# Patient Record
Sex: Female | Born: 2000 | Race: White | Hispanic: No | Marital: Single | Smoking: Never smoker
Health system: Southern US, Community
[De-identification: ages and names within clinical notes are randomized; demographics above are authoritative.]

## PROBLEM LIST (undated history)

## (undated) DIAGNOSIS — J45909 Unspecified asthma, uncomplicated: Secondary | ICD-10-CM

## (undated) DIAGNOSIS — E162 Hypoglycemia, unspecified: Secondary | ICD-10-CM

## (undated) HISTORY — PX: WISDOM TOOTH EXTRACTION: SHX21

## (undated) HISTORY — PX: TONSILLECTOMY: SUR1361

## (undated) HISTORY — PX: ADENOIDECTOMY: SUR15

---
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
## 2020-08-13 NOTE — L&D Delivery Note (Signed)
DELIVERY NOTE  Pt complete and at +2 station with urge to push. Patient desired to deliver on hands and knees. Pt pushed and delivered a viable female infant in LOA position. Anterior and posterior shoulders spontaneously delivered with next two pushes; body easily followed next. Infant placed on mothers abdomen and bulb suction of mouth and nose performed. Cord was then clamped and cut by FOB. Cord blood obtained, 3VC. Baby had a vigorous spontaneous cry noted. Placenta then delivered at 0207 intact. Fundal massage performed and pitocin per protocol. Fundus firm. The following lacerations were noted: labial abrasions, no repair needed, EBL 200cc. Mother and baby stable. Counts correct   Infant time: 31 Gender: female, considering circ Placenta time: 0207 Apgars: 9/9 Weight: pending skin-to-skin

## 2020-11-08 LAB — OB RESULTS CONSOLE HEPATITIS B SURFACE ANTIGEN: Hepatitis B Surface Ag: NEGATIVE

## 2020-11-08 LAB — OB RESULTS CONSOLE RUBELLA ANTIBODY, IGM: Rubella: IMMUNE

## 2020-11-08 LAB — OB RESULTS CONSOLE GC/CHLAMYDIA
Chlamydia: NEGATIVE
Gonorrhea: NEGATIVE

## 2020-11-08 LAB — HEPATITIS C ANTIBODY: HCV Ab: NEGATIVE

## 2020-11-08 LAB — OB RESULTS CONSOLE RPR: RPR: NONREACTIVE

## 2020-11-08 LAB — OB RESULTS CONSOLE HIV ANTIBODY (ROUTINE TESTING): HIV: NONREACTIVE

## 2021-04-03 ENCOUNTER — Other Ambulatory Visit: Payer: Self-pay

## 2021-04-03 ENCOUNTER — Encounter (HOSPITAL_COMMUNITY): Payer: Self-pay | Admitting: Obstetrics and Gynecology

## 2021-04-03 ENCOUNTER — Inpatient Hospital Stay (HOSPITAL_COMMUNITY): Payer: Medicaid Other

## 2021-04-03 ENCOUNTER — Inpatient Hospital Stay (HOSPITAL_COMMUNITY)
Admission: AD | Admit: 2021-04-03 | Discharge: 2021-04-04 | Disposition: A | Discharge status: Home / Self Care | Payer: Medicaid Other | Attending: Obstetrics and Gynecology | Admitting: Obstetrics and Gynecology

## 2021-04-03 DIAGNOSIS — Z79899 Other long term (current) drug therapy: Secondary | ICD-10-CM | POA: Insufficient documentation

## 2021-04-03 DIAGNOSIS — Z3A32 32 weeks gestation of pregnancy: Secondary | ICD-10-CM | POA: Diagnosis not present

## 2021-04-03 DIAGNOSIS — O26893 Other specified pregnancy related conditions, third trimester: Secondary | ICD-10-CM | POA: Diagnosis not present

## 2021-04-03 DIAGNOSIS — U071 COVID-19: Secondary | ICD-10-CM

## 2021-04-03 DIAGNOSIS — R059 Cough, unspecified: Secondary | ICD-10-CM

## 2021-04-03 DIAGNOSIS — O9852 Other viral diseases complicating childbirth: Secondary | ICD-10-CM | POA: Insufficient documentation

## 2021-04-03 DIAGNOSIS — R079 Chest pain, unspecified: Secondary | ICD-10-CM | POA: Insufficient documentation

## 2021-04-03 DIAGNOSIS — O98513 Other viral diseases complicating pregnancy, third trimester: Secondary | ICD-10-CM | POA: Diagnosis not present

## 2021-04-03 DIAGNOSIS — J111 Influenza due to unidentified influenza virus with other respiratory manifestations: Secondary | ICD-10-CM

## 2021-04-03 HISTORY — DX: Unspecified asthma, uncomplicated: J45.909

## 2021-04-03 LAB — COMPREHENSIVE METABOLIC PANEL
ALT: 32 U/L (ref 0–44)
AST: 32 U/L (ref 15–41)
Albumin: 2.9 g/dL — ABNORMAL LOW (ref 3.5–5.0)
Alkaline Phosphatase: 79 U/L (ref 38–126)
Anion gap: 10 (ref 5–15)
BUN: <5 mg/dL — ABNORMAL LOW (ref 6–20)
CO2: 18 mmol/L — ABNORMAL LOW (ref 22–32)
Calcium: 8.6 mg/dL — ABNORMAL LOW (ref 8.9–10.3)
Chloride: 103 mmol/L (ref 98–111)
Creatinine, Ser: 0.53 mg/dL (ref 0.44–1.00)
GFR, Estimated: >60 mL/min (ref >60)
Glucose, Bld: 86 mg/dL (ref 70–99)
Potassium: 3.1 mmol/L — ABNORMAL LOW (ref 3.5–5.1)
Sodium: 131 mmol/L — ABNORMAL LOW (ref 135–145)
Total Bilirubin: 0.6 mg/dL (ref 0.3–1.2)
Total Protein: 5.6 g/dL — ABNORMAL LOW (ref 6.5–8.1)

## 2021-04-03 LAB — CBC WITH DIFFERENTIAL/PLATELET
Abs Immature Granulocytes: 0.04 10*3/uL (ref 0.00–0.07)
Basophils Absolute: 0 10*3/uL (ref 0.0–0.1)
Basophils Relative: 0 %
Eosinophils Absolute: 0 10*3/uL (ref 0.0–0.5)
Eosinophils Relative: 1 %
HCT: 33.8 % — ABNORMAL LOW (ref 36.0–46.0)
Hemoglobin: 11.4 g/dL — ABNORMAL LOW (ref 12.0–15.0)
Immature Granulocytes: 1 %
Lymphocytes Relative: 5 %
Lymphs Abs: 0.4 10*3/uL — ABNORMAL LOW (ref 0.7–4.0)
MCH: 32.9 pg (ref 26.0–34.0)
MCHC: 33.7 g/dL (ref 30.0–36.0)
MCV: 97.4 fL (ref 80.0–100.0)
Monocytes Absolute: 0.9 10*3/uL (ref 0.1–1.0)
Monocytes Relative: 11 %
Neutro Abs: 6.6 10*3/uL (ref 1.7–7.7)
Neutrophils Relative %: 82 %
Platelets: 180 10*3/uL (ref 150–400)
RBC: 3.47 MIL/uL — ABNORMAL LOW (ref 3.87–5.11)
RDW: 12.2 % (ref 11.5–15.5)
WBC: 8 10*3/uL (ref 4.0–10.5)
nRBC: 0 % (ref 0.0–0.2)

## 2021-04-03 LAB — URINALYSIS, ROUTINE W REFLEX MICROSCOPIC
Bilirubin Urine: NEGATIVE
Glucose, UA: NEGATIVE mg/dL
Hgb urine dipstick: NEGATIVE
Ketones, ur: 20 mg/dL — AB
Leukocytes,Ua: NEGATIVE
Nitrite: NEGATIVE
Protein, ur: NEGATIVE mg/dL
Specific Gravity, Urine: 1.014 (ref 1.005–1.030)
pH: 6 (ref 5.0–8.0)

## 2021-04-03 LAB — TROPONIN I (HIGH SENSITIVITY): Troponin I (High Sensitivity): 8 ng/L (ref <18)

## 2021-04-03 LAB — LACTIC ACID, PLASMA: Lactic Acid, Venous: 1.1 mmol/L (ref 0.5–1.9)

## 2021-04-03 LAB — BRAIN NATRIURETIC PEPTIDE: B Natriuretic Peptide: 46.1 pg/mL (ref 0.0–100.0)

## 2021-04-03 MED ORDER — ACETAMINOPHEN 500 MG PO TABS
1000.0000 mg | ORAL_TABLET | Freq: Once | ORAL | Status: AC
  Administered 2021-04-03: 1000 mg via ORAL
  Filled 2021-04-03: qty 2

## 2021-04-03 MED ORDER — ONDANSETRON HCL 4 MG/2ML IJ SOLN
4.0000 mg | Freq: Once | INTRAMUSCULAR | Status: AC
  Administered 2021-04-03: 4 mg via INTRAVENOUS
  Filled 2021-04-03: qty 2

## 2021-04-03 MED ORDER — LACTATED RINGERS IV BOLUS
1000.0000 mL | Freq: Once | INTRAVENOUS | Status: AC
  Administered 2021-04-03: 1000 mL via INTRAVENOUS

## 2021-04-03 NOTE — MAU Note (Signed)
Pt reports pain in her bilateral lower/mid back since this am, pain is now radiating to her lower abd. Denies bleeding . Reports good fetal movment.

## 2021-04-03 NOTE — MAU Provider Note (Signed)
History     CSN: [id number]  Arrival date and time: 04/03/21 [id number]   Event Date/Time   First Provider Initiated Contact with Patient 04/03/21 2035      Chief Complaint  Patient presents with   Back Pain   Abdominal Pain   HPI Deborah Jarvis is a 20 y.o. G1P0 at 5613w1d who presents with body aches, sore throat, and cough.  Symptoms started 3 days ago.  Went to her PCP today and had a negative COVID test.  Body aches worsened today so she presents here.  Reports productive cough with associated substernal chest pain.  Chest pain occurs with coughing episodes.  Had fever in the office earlier today.  Reports body aches but pain is primarily throughout her low back.  She rates it a 7/10.  She has not been treating her symptoms.  Denies shortness of breath, abdominal pain, vaginal bleeding.  Reports good fetal movement.  OB History     Gravida  1   Para      Term      Preterm      AB      Living         SAB      IAB      Ectopic      Multiple      Live Births              Past Medical History:  Diagnosis Date   Asthma     Past Surgical History:  Procedure Laterality Date   ADENOIDECTOMY     TONSILLECTOMY     WISDOM TOOTH EXTRACTION      History reviewed. No pertinent family history.  Social History   Tobacco Use   Smoking status: Never    Allergies:  Allergies  Allergen Reactions   Morphine And Related     Facility-Administered Medications Prior to Admission  Medication Dose Route Frequency Provider Last Rate Last Admin   omalizumab Geoffry Paradise(XOLAIR) injection 150 mg  150 mg Subcutaneous Q28 days Jessica PriestKozlow, Eric J, MD   150 mg at 02/28/17 1546   Medications Prior to Admission  Medication Sig Dispense Refill Last Dose   albuterol (PROAIR HFA) 108 (90 BASE) MCG/ACT inhaler Inhale 2 puffs into the lungs every 4 (four) hours as needed for wheezing or shortness of breath.      amoxicillin-clavulanate (AUGMENTIN) 875-125 MG tablet Take 1 tablet by mouth 2  (two) times daily.      cetirizine (ZYRTEC) 10 MG tablet Take 10 mg by mouth daily.      cyproheptadine (PERIACTIN) 4 MG tablet TAKE 1/2 TO 1 TABLET EVERY EVENING AS DIRECTED 30 tablet 5    EPINEPHrine (EPIPEN 2-PAK) 0.3 mg/0.3 mL IJ SOAJ injection Inject 0.3 mg into the muscle once.      ranitidine (ZANTAC) 150 MG tablet Take 150 mg by mouth at bedtime.      Water For Injection Sterile (STERILE WATER, PRESERVATIVE FREE,) injection Inject 10 mLs as directed as directed. 10 mL 11    XOLAIR 150 MG injection INJECT 150MG  SUBCUTANEOUSLY EVER 28 DAYS 1 each 11     Review of Systems  Constitutional:  Positive for chills, fatigue and fever.  HENT:  Positive for sore throat.   Respiratory:  Positive for cough. Negative for shortness of breath and wheezing.   Cardiovascular:  Positive for chest pain.  Gastrointestinal: Negative.   Genitourinary: Negative.   Musculoskeletal:  Positive for myalgias.  Neurological:  Positive for headaches.  Physical Exam   Blood pressure (!) 109/51, pulse 87, temperature 99.3 F (37.4 C), resp. rate 16, height 5' (1.524 m), weight 62.1 kg, SpO2 100 %.  Patient Vitals for the past 24 hrs:  BP Temp Temp src Pulse Resp SpO2 Height Weight  04/04/21 0010 (!) 109/51 -- -- 87 16 100 % -- --  04/03/21 2207 (!) 110/45 99.3 F (37.4 C) -- (!) 107 17 98 % -- --  04/03/21 2113 129/67 -- -- (!) 117 -- -- -- --  04/03/21 2110 -- -- -- -- -- 99 % -- --  04/03/21 2105 -- -- -- -- -- 99 % -- --  04/03/21 2100 -- -- -- -- -- 100 % -- --  04/03/21 2055 -- -- -- -- -- 99 % -- --  04/03/21 2050 -- -- -- -- -- 98 % -- --  04/03/21 2045 -- -- -- -- -- 99 % -- --  04/03/21 2040 -- -- -- -- -- 100 % -- --  04/03/21 2035 -- -- -- -- -- 99 % -- --  04/03/21 2030 -- -- -- -- -- 100 % -- --  04/03/21 2025 -- -- -- -- -- 99 % -- --  04/03/21 2020 -- -- -- -- -- 99 % -- --  04/03/21 2015 -- -- -- -- -- 99 % -- --  04/03/21 2010 120/61 99.7 F (37.6 C) Oral (!) 142 20 100 % 5' (1.524  m) 62.1 kg    Physical Exam Vitals and nursing note reviewed.  Constitutional:      General: She is not in acute distress.    Appearance: She is well-developed. She is ill-appearing.  HENT:     Head: Normocephalic and atraumatic.  Eyes:     General: No scleral icterus. Cardiovascular:     Rate and Rhythm: Regular rhythm. Tachycardia present.     Heart sounds: Normal heart sounds.  Pulmonary:     Effort: Pulmonary effort is normal. No respiratory distress.     Breath sounds: Normal breath sounds. No wheezing.  Skin:    General: Skin is warm and dry.  Neurological:     Mental Status: She is alert.  Psychiatric:        Mood and Affect: Mood normal.        Behavior: Behavior normal.   NST:  Baseline: 150 bpm, Variability: Good {> 6 bpm), Accelerations: Reactive, and Decelerations: Absent  MAU Course  Procedures Results for orders placed or performed during the hospital encounter of 04/03/21 (from the past 24 hour(s))  SARS CORONAVIRUS 2 (TAT 6-24 HRS) Nasopharyngeal Nasopharyngeal Swab     Status: Abnormal   Collection Time: 04/03/21  8:27 PM   Specimen: Nasopharyngeal Swab  Result Value Ref Range   SARS Coronavirus 2 POSITIVE (A) NEGATIVE  CBC with Differential/Platelet     Status: Abnormal   Collection Time: 04/03/21  9:21 PM  Result Value Ref Range   WBC 8.0 4.0 - 10.5 K/uL   RBC 3.47 (L) 3.87 - 5.11 MIL/uL   Hemoglobin 11.4 (L) 12.0 - 15.0 g/dL   HCT 19.4 (L) 17.4 - 08.1 %   MCV 97.4 80.0 - 100.0 fL   MCH 32.9 26.0 - 34.0 pg   MCHC 33.7 30.0 - 36.0 g/dL   RDW 44.8 18.5 - 63.1 %   Platelets 180 150 - 400 K/uL   nRBC 0.0 0.0 - 0.2 %   Neutrophils Relative % 82 %   Neutro Abs 6.6 1.7 - 7.7  K/uL   Lymphocytes Relative 5 %   Lymphs Abs 0.4 (L) 0.7 - 4.0 K/uL   Monocytes Relative 11 %   Monocytes Absolute 0.9 0.1 - 1.0 K/uL   Eosinophils Relative 1 %   Eosinophils Absolute 0.0 0.0 - 0.5 K/uL   Basophils Relative 0 %   Basophils Absolute 0.0 0.0 - 0.1 K/uL    Immature Granulocytes 1 %   Abs Immature Granulocytes 0.04 0.00 - 0.07 K/uL  Lactic acid, plasma     Status: None   Collection Time: 04/03/21  9:21 PM  Result Value Ref Range   Lactic Acid, Venous 1.1 0.5 - 1.9 mmol/L  Comprehensive metabolic panel     Status: Abnormal   Collection Time: 04/03/21  9:21 PM  Result Value Ref Range   Sodium 131 (L) 135 - 145 mmol/L   Potassium 3.1 (L) 3.5 - 5.1 mmol/L   Chloride 103 98 - 111 mmol/L   CO2 18 (L) 22 - 32 mmol/L   Glucose, Bld 86 70 - 99 mg/dL   BUN <5 (L) 6 - 20 mg/dL   Creatinine, Ser 3.29 0.44 - 1.00 mg/dL   Calcium 8.6 (L) 8.9 - 10.3 mg/dL   Total Protein 5.6 (L) 6.5 - 8.1 g/dL   Albumin 2.9 (L) 3.5 - 5.0 g/dL   AST 32 15 - 41 U/L   ALT 32 0 - 44 U/L   Alkaline Phosphatase 79 38 - 126 U/L   Total Bilirubin 0.6 0.3 - 1.2 mg/dL   GFR, Estimated >51 >88 mL/min   Anion gap 10 5 - 15  Troponin I (High Sensitivity)     Status: None   Collection Time: 04/03/21  9:21 PM  Result Value Ref Range   Troponin I (High Sensitivity) 8 <18 ng/L  Brain natriuretic peptide     Status: None   Collection Time: 04/03/21  9:21 PM  Result Value Ref Range   B Natriuretic Peptide 46.1 0.0 - 100.0 pg/mL  Urinalysis, Routine w reflex microscopic     Status: Abnormal   Collection Time: 04/03/21 10:10 PM  Result Value Ref Range   Color, Urine YELLOW YELLOW   APPearance HAZY (A) CLEAR   Specific Gravity, Urine 1.014 1.005 - 1.030   pH 6.0 5.0 - 8.0   Glucose, UA NEGATIVE NEGATIVE mg/dL   Hgb urine dipstick NEGATIVE NEGATIVE   Bilirubin Urine NEGATIVE NEGATIVE   Ketones, ur 20 (A) NEGATIVE mg/dL   Protein, ur NEGATIVE NEGATIVE mg/dL   Nitrite NEGATIVE NEGATIVE   Leukocytes,Ua NEGATIVE NEGATIVE   DG Chest 1 View  Result Date: 04/03/2021 CLINICAL DATA:  Back pain. EXAM: CHEST  1 VIEW COMPARISON:  Chest radiograph dated 07/14/2018. FINDINGS: The heart size and mediastinal contours are within normal limits. Both lungs are clear. The visualized skeletal  structures are unremarkable. IMPRESSION: No active disease. Electronically Signed   By: Elgie Collard M.D.   On: 04/03/2021 21:34    MDM Patient presents multiple complaints consistent with COVID-19.  Other than tachycardia, and her vital signs are stable and her oxygen saturation is good.  Her labs, EKG, and chest x-ray are reassuring.  Her symptoms and heart rate improved with IV fluids and Tylenol.  Her COVID test through MAU is positive.  Discussed symptomatic treatment and quarantine.  Assessment and Plan   1. COVID-19 affecting pregnancy in third trimester   2. Cough   3. Chest pain   4. [redacted] weeks gestation of pregnancy   -given list of  OTC meds safe in pregnancy -quarantine x10 days from symptom onset   Judeth Horn 04/03/2021, 8:35 PM

## 2021-04-03 NOTE — Discharge Instructions (Signed)

## 2021-04-04 LAB — SARS CORONAVIRUS 2 (TAT 6-24 HRS): SARS Coronavirus 2: POSITIVE — AB

## 2021-05-03 LAB — OB RESULTS CONSOLE GBS: GBS: NEGATIVE

## 2021-05-28 ENCOUNTER — Encounter (HOSPITAL_COMMUNITY): Payer: Self-pay | Admitting: Obstetrics

## 2021-05-28 ENCOUNTER — Inpatient Hospital Stay (HOSPITAL_COMMUNITY)
Admission: AD | Admit: 2021-05-28 | Discharge: 2021-05-31 | DRG: 807 | Disposition: A | Discharge status: Home / Self Care | Payer: Medicaid Other | Attending: Obstetrics and Gynecology | Admitting: Obstetrics and Gynecology

## 2021-05-28 ENCOUNTER — Inpatient Hospital Stay (EMERGENCY_DEPARTMENT_HOSPITAL)
Admission: AD | Admit: 2021-05-28 | Discharge: 2021-05-28 | Disposition: A | Discharge status: Home / Self Care | Payer: Medicaid Other | Source: Home / Self Care | Attending: Obstetrics and Gynecology | Admitting: Obstetrics and Gynecology

## 2021-05-28 ENCOUNTER — Other Ambulatory Visit: Payer: Self-pay

## 2021-05-28 ENCOUNTER — Encounter (HOSPITAL_COMMUNITY): Payer: Self-pay | Admitting: Obstetrics and Gynecology

## 2021-05-28 DIAGNOSIS — Z3A39 39 weeks gestation of pregnancy: Secondary | ICD-10-CM

## 2021-05-28 DIAGNOSIS — O471 False labor at or after 37 completed weeks of gestation: Secondary | ICD-10-CM | POA: Insufficient documentation

## 2021-05-28 DIAGNOSIS — O480 Post-term pregnancy: Secondary | ICD-10-CM | POA: Diagnosis present

## 2021-05-28 DIAGNOSIS — O26893 Other specified pregnancy related conditions, third trimester: Secondary | ICD-10-CM | POA: Diagnosis present

## 2021-05-28 HISTORY — DX: Hypoglycemia, unspecified: E16.2

## 2021-05-28 LAB — CBC
HCT: 36 % (ref 36.0–46.0)
Hemoglobin: 12 g/dL (ref 12.0–15.0)
MCH: 31.1 pg (ref 26.0–34.0)
MCHC: 33.3 g/dL (ref 30.0–36.0)
MCV: 93.3 fL (ref 80.0–100.0)
Platelets: 239 10*3/uL (ref 150–400)
RBC: 3.86 MIL/uL — ABNORMAL LOW (ref 3.87–5.11)
RDW: 13.1 % (ref 11.5–15.5)
WBC: 14.3 10*3/uL — ABNORMAL HIGH (ref 4.0–10.5)
nRBC: 0 % (ref 0.0–0.2)

## 2021-05-28 LAB — POCT FERN TEST: POCT Fern Test: NEGATIVE

## 2021-05-28 LAB — AMNISURE RUPTURE OF MEMBRANE (ROM) NOT AT ARMC: Amnisure ROM: NEGATIVE

## 2021-05-28 MED ORDER — LACTATED RINGERS IV SOLN: 500.0000 mL | INTRAVENOUS | Status: DC | PRN

## 2021-05-28 MED ORDER — OXYTOCIN-SODIUM CHLORIDE 30-0.9 UT/500ML-% IV SOLN
2.5000 [IU]/h | INTRAVENOUS | Status: DC
  Administered 2021-05-29: 2.5 [IU]/h via INTRAVENOUS
  Filled 2021-05-28: qty 500

## 2021-05-28 MED ORDER — ONDANSETRON HCL 4 MG/2ML IJ SOLN: 4.0000 mg | Freq: Four times a day (QID) | INTRAMUSCULAR | Status: DC | PRN

## 2021-05-28 MED ORDER — LACTATED RINGERS IV SOLN
INTRAVENOUS | Status: DC
Start: 2021-05-28 — End: 2021-05-29

## 2021-05-28 MED ORDER — OXYTOCIN BOLUS FROM INFUSION
333.0000 mL | Freq: Once | INTRAVENOUS | Status: AC
  Administered 2021-05-29: 333 mL via INTRAVENOUS

## 2021-05-28 MED ORDER — ACETAMINOPHEN 325 MG PO TABS: 650.0000 mg | ORAL_TABLET | ORAL | Status: DC | PRN

## 2021-05-28 MED ORDER — FENTANYL CITRATE (PF) 100 MCG/2ML IJ SOLN
50.0000 ug | INTRAMUSCULAR | Status: DC | PRN
  Administered 2021-05-28: 50 ug via INTRAVENOUS
  Administered 2021-05-28: 100 ug via INTRAVENOUS
  Filled 2021-05-28 (×3): qty 2

## 2021-05-28 MED ORDER — SOD CITRATE-CITRIC ACID 500-334 MG/5ML PO SOLN: 30.0000 mL | ORAL | Status: DC | PRN

## 2021-05-28 MED ORDER — LIDOCAINE HCL (PF) 1 % IJ SOLN: 30.0000 mL | INTRAMUSCULAR | Status: DC | PRN

## 2021-05-28 NOTE — Progress Notes (Signed)
Patient declining epidural, feeling more painful contractions BP 127/69   Pulse 71   Temp 98.2 F (36.8 C) (Oral)   Resp 20   Ht 5' (1.524 m)   BMI 26.76 kg/m  Clear AROM @ 2030, CE 7/90/-2 Cat 1 tracing, TOCO q2-17m  Progressing into active labor, GBS neg. Baby boy. Anticipate SVD

## 2021-05-28 NOTE — MAU Note (Signed)
Pt reports to mau with c/o ctx about every 5-10 min.  Reports ctx started yesterday but were irregular and less painful then.   States she had one gush of fluid last night but is unsure of time. States she has "felt wet" since then but has not had any more episodes of leaking since then.  +FM  denies bleeding

## 2021-05-28 NOTE — MAU Note (Signed)
.  Deborah Jarvis is a 20 y.o. at [redacted]w[redacted]d here in MAU reporting: contractions every , started around 1800  Pain score: 10/10 Vitals:   05/28/21 2058  BP: (!) 140/92  Pulse: 100  Resp: 20  Temp: 98.1 F (36.7 C)    Lab orders placed from triage:  UA

## 2021-05-28 NOTE — H&P (Signed)
Deborah Jarvis is a 20 y.o. female presenting for contractions worsening q4-13m. +FM, denies VB, LOF.   PNC c/b h/o HA, h/o depression with SI (seeking counseling), and anxiety not on meds  GBS neg OB History     Gravida  1   Para      Term      Preterm      AB      Living         SAB      IAB      Ectopic      Multiple      Live Births             Past Medical History:  Diagnosis Date   Asthma    Hypoglycemia    Past Surgical History:  Procedure Laterality Date   ADENOIDECTOMY     TONSILLECTOMY     WISDOM TOOTH EXTRACTION     Family History: family history is not on file. Social History:  reports that she has never smoked. She has never used smokeless tobacco. She reports that she does not drink alcohol and does not use drugs.     Maternal Diabetes: No1hr 116 Genetic Screening: Normal Maternal Ultrasounds/Referrals: Normal Fetal Ultrasounds or other Referrals:  None Maternal Substance Abuse:  No Significant Maternal Medications:  None Significant Maternal Lab Results:  Group B Strep negative Other Comments:  None  Review of Systems  Constitutional:  Negative for chills and fever.  Respiratory:  Negative for shortness of breath.   Cardiovascular:  Negative for chest pain, palpitations and leg swelling.  Gastrointestinal:  Negative for abdominal pain and vomiting.  Neurological:  Negative for dizziness, weakness and headaches.  Psychiatric/Behavioral:  Negative for suicidal ideas.   Maternal Medical History:  Reason for admission: Contractions.   Contractions: Onset was 1-2 hours ago.   Frequency: regular.   Perceived severity is moderate.   Fetal activity: Perceived fetal activity is normal.   Prenatal complications: No bleeding or PIH.   Prenatal Complications - Diabetes: none.  Dilation: 4.5 Effacement (%): 90 Station: -1 Exam by:: Lafonda Mosses Ansah-Mensah, rnc Blood pressure 127/69, pulse 71, temperature 98.2 F (36.8 C), temperature  source Oral, resp. rate 20, height 5' (1.524 m). Exam Physical Exam Constitutional:      General: She is not in acute distress.    Appearance: She is well-developed.  HENT:     Head: Normocephalic and atraumatic.  Eyes:     Pupils: Pupils are equal, round, and reactive to light.  Cardiovascular:     Rate and Rhythm: Normal rate and regular rhythm.     Heart sounds: No murmur heard.   No gallop.  Abdominal:     Tenderness: There is no abdominal tenderness. There is no guarding or rebound.  Genitourinary:    Vagina: Normal.  Musculoskeletal:        General: Normal range of motion.     Cervical back: Normal range of motion and neck supple.  Skin:    General: Skin is warm and dry.  Neurological:     Mental Status: She is alert and oriented to person, place, and time.    Prenatal labs: ABO, Rh:  AB pos Antibody:  neg Rubella:  imm RPR:   nr HBsAg:   neg HIV:   nr GBS:   NEG  Assessment/Plan: This is a well dated 20yo G1P0 @ 39 6/7 admitted in labor, GBS neg. Desires epidural. Category 1 tracing, TOCO q3-5. Augment as needed, anticipate  SVD   Valerie Roys Beryle Bagsby 05/28/2021, 9:34 PM

## 2021-05-28 NOTE — MAU Provider Note (Signed)
Obstetric Attending MAU Note  Chief Complaint:  Contractions and Rupture of Membranes   Event Date/Time   First Provider Initiated Contact with Patient 05/28/21 1156     HPI: Deborah Jarvis is a 20 y.o. G1P0 at [redacted]w[redacted]d who presents to maternity admissions reporting labor. Contractions since last night. Also, reports leaking fluid. Remains wet. Had gush last night. Was 2 cm in the office. Pain is crampy, intermittent. Every 5-10 minutes. Denies vaginal bleeding. Good fetal movement.   Pregnancy Course: Receives care at [facility] GBS negative PN records reviewed  Patient Active Problem List   Diagnosis Date Noted   Idiopathic urticaria 04/05/2015   H/O extrinsic asthma 04/05/2015   Migraine headache 04/05/2015   GERD (gastroesophageal reflux disease) 04/05/2015   Allergic rhinitis 04/05/2015    Past Medical History:  Diagnosis Date   Asthma    Hypoglycemia     OB History  Gravida Para Term Preterm AB Living  1            SAB IAB Ectopic Multiple Live Births               # Outcome Date GA Lbr Len/2nd Weight Sex Delivery Anes PTL Lv  1 Current             Past Surgical History:  Procedure Laterality Date   ADENOIDECTOMY     TONSILLECTOMY     WISDOM TOOTH EXTRACTION      Family History: History reviewed. No pertinent family history.  Social History: Social History   Tobacco Use   Smoking status: Never   Smokeless tobacco: Never    Allergies:  Allergies  Allergen Reactions   Morphine And Related     No medications prior to admission.    ROS: Pertinent findings in history of present illness.  Physical Exam  Blood pressure 118/68, pulse 96, temperature 97.8 F (36.6 C), temperature source Oral, resp. rate 15, SpO2 97 %. CONSTITUTIONAL: Well-developed, well-nourished female in no acute distress.  HENT:  Normocephalic, atraumatic, External right and left ear normal. Oropharynx is clear and moist EYES: Conjunctivae and EOM are normal. Pupils are  equal, round, and reactive to light. No scleral icterus.  NECK: Normal range of motion, supple, no masses SKIN: Skin is warm and dry. No rash noted. Not diaphoretic. No erythema. No pallor. NEUROLGIC: Alert and oriented to person, place, and time. Normal reflexes, muscle tone coordination. No cranial nerve deficit noted. PSYCHIATRIC: Normal mood and affect. Normal behavior. Normal judgment and thought content. CARDIOVASCULAR: Normal heart rate noted, regular rhythm RESPIRATORY: Effort and breath sounds normal, no problems with respiration noted ABDOMEN: Soft, nontender, nondistended, gravid appropriate for gestational age MUSCULOSKELETAL: Normal range of motion. No edema and no tenderness. 2+ distal pulses.  SPECULUM EXAM: NEFG, physiologic discharge, no blood, cervix clean Dilation: 3 Effacement (%): 50 Cervical Position: Posterior Station: -2 Presentation: Vertex Exam by:: Marvel Plan RN  FHT:  Baseline 135 , moderate variability, accelerations present, no decelerations Contractions: q 2-5 mins   Labs: Results for orders placed or performed during the hospital encounter of 05/28/21 (from the past 24 hour(s))  Fern Test     Status: Normal   Collection Time: 05/28/21 12:01 PM  Result Value Ref Range   POCT Fern Test Negative = intact amniotic membranes   Amnisure rupture of membrane (rom)not at [place]     Status: None   Collection Time: 05/28/21 12:01 PM  Result Value Ref Range   Amnisure ROM NEGATIVE  Imaging:  No results found.  MAU Course: Monitored x 2.5 hours without significant change No evidence of ROM Amnisure is negative Fern slide is negative  Assessment: 1. False labor after 37 completed weeks of gestation   2. [redacted] weeks gestation of pregnancy     Plan: Discharge home Labor precautions and fetal kick counts reviewed Go walking Comfort measures given Follow up with OB provider   Follow-up Information     [facility] Follow up.    Contact information: [address]  [address] [place] [address] [phone]                 Allergies as of 05/28/2021       Reactions   Morphine And Related         Medication List    You have not been prescribed any medications.     Reva Bores, MD 05/28/2021 1:56 PM

## 2021-05-29 ENCOUNTER — Encounter (HOSPITAL_COMMUNITY): Payer: Self-pay | Admitting: Obstetrics

## 2021-05-29 LAB — CBC
HCT: 34.3 % — ABNORMAL LOW (ref 36.0–46.0)
Hemoglobin: 11.5 g/dL — ABNORMAL LOW (ref 12.0–15.0)
MCH: 30.8 pg (ref 26.0–34.0)
MCHC: 33.5 g/dL (ref 30.0–36.0)
MCV: 92 fL (ref 80.0–100.0)
Platelets: 215 10*3/uL (ref 150–400)
RBC: 3.73 MIL/uL — ABNORMAL LOW (ref 3.87–5.11)
RDW: 13.2 % (ref 11.5–15.5)
WBC: 22.3 10*3/uL — ABNORMAL HIGH (ref 4.0–10.5)
nRBC: 0 % (ref 0.0–0.2)

## 2021-05-29 LAB — TYPE AND SCREEN
ABO/RH(D): AB POS
Antibody Screen: NEGATIVE

## 2021-05-29 LAB — RPR: RPR Ser Ql: NONREACTIVE

## 2021-05-29 MED ORDER — SENNOSIDES-DOCUSATE SODIUM 8.6-50 MG PO TABS
2.0000 | ORAL_TABLET | Freq: Every day | ORAL | Status: DC
  Administered 2021-05-30 – 2021-05-31 (×2): 2 via ORAL
  Filled 2021-05-29 (×2): qty 2

## 2021-05-29 MED ORDER — ONDANSETRON HCL 4 MG/2ML IJ SOLN: 4.0000 mg | INTRAMUSCULAR | Status: DC | PRN

## 2021-05-29 MED ORDER — COCONUT OIL OIL
1.0000 "application " | TOPICAL_OIL | Status: DC | PRN
  Administered 2021-05-30: 1 via TOPICAL

## 2021-05-29 MED ORDER — BENZOCAINE-MENTHOL 20-0.5 % EX AERO
1.0000 "application " | INHALATION_SPRAY | CUTANEOUS | Status: DC | PRN
  Administered 2021-05-29: 1 via TOPICAL
  Filled 2021-05-29: qty 56

## 2021-05-29 MED ORDER — DIBUCAINE (PERIANAL) 1 % EX OINT: 1.0000 "application " | TOPICAL_OINTMENT | CUTANEOUS | Status: DC | PRN

## 2021-05-29 MED ORDER — SODIUM CHLORIDE 0.9% FLUSH: 3.0000 mL | Freq: Two times a day (BID) | INTRAVENOUS | Status: DC

## 2021-05-29 MED ORDER — SODIUM CHLORIDE 0.9% FLUSH
3.0000 mL | INTRAVENOUS | Status: DC | PRN
Start: 2021-05-29 — End: 2021-05-29

## 2021-05-29 MED ORDER — SIMETHICONE 80 MG PO CHEW: 80.0000 mg | CHEWABLE_TABLET | ORAL | Status: DC | PRN

## 2021-05-29 MED ORDER — SODIUM CHLORIDE 0.9 % IV SOLN: 250.0000 mL | INTRAVENOUS | Status: DC | PRN

## 2021-05-29 MED ORDER — DIPHENHYDRAMINE HCL 25 MG PO CAPS: 25.0000 mg | ORAL_CAPSULE | Freq: Four times a day (QID) | ORAL | Status: DC | PRN

## 2021-05-29 MED ORDER — WITCH HAZEL-GLYCERIN EX PADS: 1.0000 "application " | MEDICATED_PAD | CUTANEOUS | Status: DC | PRN

## 2021-05-29 MED ORDER — TETANUS-DIPHTH-ACELL PERTUSSIS 5-2.5-18.5 LF-MCG/0.5 IM SUSY: 0.5000 mL | PREFILLED_SYRINGE | Freq: Once | INTRAMUSCULAR | Status: DC

## 2021-05-29 MED ORDER — IBUPROFEN 600 MG PO TABS
600.0000 mg | ORAL_TABLET | Freq: Four times a day (QID) | ORAL | Status: DC
  Administered 2021-05-29 – 2021-05-31 (×10): 600 mg via ORAL
  Filled 2021-05-29 (×10): qty 1

## 2021-05-29 MED ORDER — PRENATAL MULTIVITAMIN CH
1.0000 | ORAL_TABLET | Freq: Every day | ORAL | Status: DC
  Administered 2021-05-29 – 2021-05-31 (×3): 1 via ORAL
  Filled 2021-05-29 (×3): qty 1

## 2021-05-29 MED ORDER — ONDANSETRON HCL 4 MG PO TABS: 4.0000 mg | ORAL_TABLET | ORAL | Status: DC | PRN

## 2021-05-29 MED ORDER — ZOLPIDEM TARTRATE 5 MG PO TABS: 5.0000 mg | ORAL_TABLET | Freq: Every evening | ORAL | Status: DC | PRN

## 2021-05-29 MED ORDER — ACETAMINOPHEN 325 MG PO TABS: 650.0000 mg | ORAL_TABLET | ORAL | Status: DC | PRN

## 2021-05-29 NOTE — Lactation Note (Signed)
This note was copied from a baby's chart. Lactation Consultation Note  Patient Name: Deborah Jarvis [id number] Date: 05/29/2021 Reason for consult: Initial assessment;Term;Primapara;1st time breastfeeding Age:20 hours   P1 mother whose infant is now 50 hours old.  This is a term baby at 40+0 weeks.  Baby "[name]" was asleep at mother's side when I arrived.  Mother has been trying to awaken and feed him.  Offered to assist and mother interested.  Reviewed newborn care and breast feeding basics.  Assisted mother with diaper change; large meconium.  During the diaper change "[name]" had 2 separate incidents of emesis where he held his breath; one required bulb suctioning.  Reviewed how to burp firmly and demonstrated bulb syringe use for mother.  "[name]" recovered easily and no further distress noted.  Taught hand expression; no drops noted.  Attempted to latch, however, he was not interested.  Placed him STS on mother's chest with hat and blanket cover.  He fell asleep.  Recommended mother call her RN/LC for further latch assist.  Continue to review newborn care with mother.  Father present but asleep on the couch the entire time; would be beneficial for father to participate in education when awake.   Mom made aware of O/P services, breastfeeding support groups, community resources, and our phone # for post-discharge questions.  RN updated.   Maternal Data Has patient been taught Hand Expression?: Yes Does the patient have breastfeeding experience prior to this delivery?: No  Feeding Mother's Current Feeding Choice: Breast Milk  LATCH Score Latch: Too sleepy or reluctant, no latch achieved, no sucking elicited.  Audible Swallowing: None  Type of Nipple: Everted at rest and after stimulation  Comfort (Breast/Nipple): Soft / non-tender  Hold (Positioning): Assistance needed to correctly position infant at breast and maintain latch.  LATCH Score: 5   Lactation Tools  Discussed/Used    Interventions Interventions: Breast feeding basics reviewed;Assisted with latch;Skin to skin;Breast massage;Hand express;Position options;Support pillows;Adjust position;Education;LC Services brochure  Discharge    Consult Status Consult Status: Follow-up Date: 05/30/21 Follow-up type: In-patient    Dioselina Brumbaugh R Fredia Chittenden 05/29/2021, 8:07 AM

## 2021-05-29 NOTE — Social Work (Signed)
MOB was referred for history of anxiety and depression.  * Referral screened out by Clinical Social Worker because none of the following criteria appear to apply: ~ History of anxiety/depression during this pregnancy, or of post-partum depression following prior delivery. ~ Diagnosis of anxiety and/or depression within last 3 years OR * MOB's symptoms currently being treated with medication and/or therapy. Per chart review, it is noted that MOB has a therapist.   Please contact the Clinical Social Worker if needs arise, by [facility] request, or if MOB scores greater than 9/yes to question 10 on Edinburgh Postpartum Depression Screen.   Vivi Barrack, MSW, LCSW [facility]  Clinical Social Worker  [phone] 05/29/2021  3:10 PM

## 2021-05-29 NOTE — Progress Notes (Signed)
PPD #0 No problems Afeb, VSS Fundus firm, NT at U-1 Continue routine postpartum care 

## 2021-05-29 NOTE — Lactation Note (Signed)
This note was copied from a baby's chart. Lactation Consultation Note  Patient Name: Deborah Jarvis [id number] Date: 05/29/2021 Reason for consult: L&D Initial assessment;1st time breastfeeding;Term Age:20 hours LC entered the room, mom was doing skin to skin and infant was cuing to breastfeed. Mom latched infant on her right breast using the football hold position, infant latched with depth, and was still breastfeeding after 13 minutes when LC left the room. Mom knows to breastfeed infant according to hunger cues, 8 to 12+ or more times within 24 hours, skin to skin.  Mom knows to call RN/LC on MBU if she needs assistance with latching infant at the breast.  Maternal Data     Feeding Mother's Current Feeding Choice: Breast Milk  LATCH Score Latch: Grasps breast easily, tongue down, lips flanged, rhythmical sucking.  Audible Swallowing: Spontaneous and intermittent  Type of Nipple: Everted at rest and after stimulation  Comfort (Breast/Nipple): Soft / non-tender  Hold (Positioning): Assistance needed to correctly position infant at breast and maintain latch.  LATCH Score: 9   Lactation Tools Discussed/Used    Interventions Interventions: Assisted with latch;Skin to skin;Breast compression;Adjust position;Support pillows;Position options;Education  Discharge    Consult Status Consult Status: Follow-up from L&D    Danelle Earthly 05/29/2021, 2:59 AM

## 2021-05-30 NOTE — Lactation Note (Signed)
This note was copied from a baby's chart. Lactation Consultation Note  Patient Name: Deborah Jarvis [id number] Date: 05/30/2021 Reason for consult: MD order Age:20 hours  LC in to room per MD request to initiate pumping. Mother states baby has not voided and pumping is for supplementation purposes.  Demonstrated manual pump use and collected ~ 26mL of EBM. Set up DEBP, provided education regarding frequency, cleaning and milk storage. Encouraged to pump after feedings and supplement with EBM. Talked about volume supplementation per age.   Reviewed breastfeeding basics. Discussed milk coming to volume. Reviewed NBN behavior and second day expectations with parents and encouraged to contact [facility] for support when ready to breastfeed baby and recommended to request help for questions or concerns.    All questions answered at this time.    Maternal Data Has patient been taught Hand Expression?: Yes  Feeding Mother's Current Feeding Choice: Breast Milk  Lactation Tools Discussed/Used Tools: Pump;Flanges Flange Size: 21 Breast pump type: Double-Electric Breast Pump;Manual Pump Education: Setup, frequency, and cleaning;Milk Storage Reason for Pumping: stimulation and supplementation Pumping frequency: after feedings Pumped volume: 2 mL (demonstrating hand pump use, currenty using DEBP with initiation setting)  Interventions Interventions: Expressed milk;Hand express;Breast massage;Breast feeding basics reviewed;DEBP;Hand pump;Education  Discharge Discharge Education: Engorgement and breast care Pump: Personal;Manual;DEBP  Consult Status Consult Status: Follow-up Date: 05/31/21 Follow-up type: In-patient    Lothar Prehn A Higuera Ancidey 05/30/2021, 6:03 PM

## 2021-05-30 NOTE — Progress Notes (Signed)
Baby is not being circumcised today- Peds made note of undescended testes and will hold circ for now. Orders for circ still in.

## 2021-05-30 NOTE — Progress Notes (Signed)
Patient is eating, ambulating, voiding.  Pain control is good.  Vitals:   05/29/21 1305 05/29/21 1606 05/29/21 2030 05/30/21 0552  BP: 118/65 122/68 (!) 118/58 112/64  Pulse: 63 62 60 (!) 58  Resp: 18 16 18 18   Temp: 98.2 F (36.8 C) 98.3 F (36.8 C) 98.6 F (37 C) 98.3 F (36.8 C)  TempSrc: Oral Oral Oral Oral  SpO2: 99% 100%    Height:        Fundus firm Perineum without swelling.  Lab Results  Component Value Date   WBC 22.3 (H) 05/29/2021   HGB 11.5 (L) 05/29/2021   HCT 34.3 (L) 05/29/2021   MCV 92.0 05/29/2021   PLT 215 05/29/2021    --/--/AB POS (10/16 2118)/RI  A/P Post partum day 1.  Routine care.  Expect d/c routine.    02-11-2003

## 2021-05-30 NOTE — Progress Notes (Signed)
Per nursery, Peds is not clearing baby for circumcision.

## 2021-05-31 NOTE — Lactation Note (Signed)
This note was copied from a baby's chart. Lactation Consultation Note  Patient Name: Deborah Jarvis WCHJS'C Date: 05/31/2021 Reason for consult: Follow-up assessment;Infant weight loss;Other (Comment);Term;1st time breastfeeding;Primapara (milk is in and mom pumped off > 30 ml . LC reviewed BF D/C teaching .) Age:20 hours  Maternal Data Has patient been taught Hand Expression?: Yes  Feeding Mother's Current Feeding Choice: Breast Milk and Formula  LATCH Score                    Lactation Tools Discussed/Used Tools: Shells;Pump;Flanges;Coconut oil Flange Size: 21;24 (mom aware when her milk comes in she may have to increase to a #24 F . per mom the #21 is comfortable today) Breast pump type: Manual Pump Education: Milk Storage Pumped volume: 30 mL  Interventions Interventions: Breast feeding basics reviewed;Education;Shells;Coconut oil;LC Services brochure  Discharge Discharge Education: Engorgement and breast care;Warning signs for feeding baby Pump: Personal;DEBP;Manual  Consult Status Consult Status: Complete Date: 05/31/21    [name] 05/31/2021, 12:53 PM

## 2021-05-31 NOTE — Discharge Summary (Signed)
Postpartum Discharge Summary  Date of Service updated 05/31/21     Patient Name: Deborah Jarvis DOB: 02-Sep-2000 MRN: [id number]  Date of admission: 05/28/2021 Delivery date:05/29/2021  Delivering provider: Carlisle Cater  Date of discharge: 05/31/2021  Admitting diagnosis: Post term pregnancy [O48.0] Intrauterine pregnancy: [redacted]w[redacted]d     Secondary diagnosis:  Active Problems:   Post term pregnancy  Additional problems: depression and anxiety    Discharge diagnosis: Term Pregnancy Delivered                                              Post partum procedures: none Augmentation: N/A Complications: None  Hospital course: Onset of Labor With Vaginal Delivery      20 y.o. yo G1P1001 at [redacted]w[redacted]d was admitted in Active Labor on 05/28/2021. Patient had an uncomplicated labor course as follows:  Membrane Rupture Time/Date: 10:31 PM ,05/28/2021   Delivery Method:Vaginal, Spontaneous  Episiotomy: None  Lacerations:  Labial  Patient had an uncomplicated postpartum course.  She is ambulating, tolerating a regular diet, passing flatus, and urinating well. Patient is discharged home in stable condition on 05/31/21.  Newborn Data: Birth date:05/29/2021  Birth time:1:59 AM  Gender:Female  Living status:Living  Apgars:9 ,9  Weight:3175 g    Physical exam  Vitals:   05/29/21 2030 05/30/21 0552 05/30/21 1926 05/31/21 0638  BP: (!) 118/58 112/64 114/67 121/69  Pulse: 60 (!) 58 65 (!) 57  Resp: 18 18 16 15   Temp: 98.6 F (37 C) 98.3 F (36.8 C) 98.4 F (36.9 C) 98.7 F (37.1 C)  TempSrc: Oral Oral Oral   SpO2:   98% 98%  Height:       General: alert, cooperative, and no distress Lochia: appropriate Uterine Fundus: firm Incision: N/A DVT Evaluation: No evidence of DVT seen on physical exam. Labs: Lab Results  Component Value Date   WBC 22.3 (H) 05/29/2021   HGB 11.5 (L) 05/29/2021   HCT 34.3 (L) 05/29/2021   MCV 92.0 05/29/2021   PLT 215 05/29/2021   CMP Latest Ref Rng  & Units 04/03/2021  Glucose 70 - 99 mg/dL 86  BUN 6 - 20 mg/dL 04/05/2021)  Creatinine <3(O - 1.00 mg/dL 7.56  Sodium 4.33 - 295 mmol/L 131(L)  Potassium 3.5 - 5.1 mmol/L 3.1(L)  Chloride 98 - 111 mmol/L 103  CO2 22 - 32 mmol/L 18(L)  Calcium 8.9 - 10.3 mg/dL 188)  Total Protein 6.5 - 8.1 g/dL 4.1(Y)  Total Bilirubin 0.3 - 1.2 mg/dL 0.6  Alkaline Phos 38 - 126 U/L 79  AST 15 - 41 U/L 32  ALT 0 - 44 U/L 32   Edinburgh Score: Edinburgh Postnatal Depression Scale Screening Tool 05/29/2021  I have been able to laugh and see the funny side of things. 0  I have looked forward with enjoyment to things. 0  I have blamed myself unnecessarily when things went wrong. 1  I have been anxious or worried for no good reason. 1  I have felt scared or panicky for no good reason. 0  Things have been getting on top of me. 1  I have been so unhappy that I have had difficulty sleeping. 0  I have felt sad or miserable. 0  I have been so unhappy that I have been crying. 1  The thought of harming myself has occurred to me. 0  Edinburgh Postnatal Depression Scale Total 4      After visit meds:  Allergies as of 05/31/2021       Reactions   Morphine And Related         Medication List    You have not been prescribed any medications.      Discharge home in stable condition Infant Feeding: Breast Infant Disposition:home with mother Discharge instruction: per After Visit Summary and Postpartum booklet. Activity: Advance as tolerated. Pelvic rest for 6 weeks.  Diet: routine diet Anticipated Birth Control: Unsure Postpartum Appointment:4 weeks  Future Appointments:No future appointments. Follow up Visit:  Follow-up Information     [facility], [facility] Follow up in 4 week(s).   Contact information: [address] [place] [address] [phone]                     05/31/2021 [facility] Lizabeth Leyden, MD

## 2021-06-05 ENCOUNTER — Inpatient Hospital Stay (HOSPITAL_COMMUNITY): Payer: Medicaid Other

## 2021-06-05 ENCOUNTER — Inpatient Hospital Stay (HOSPITAL_COMMUNITY): Admission: AD | Admit: 2021-06-05 | Payer: Medicaid Other | Source: Home / Self Care | Admitting: Obstetrics

## 2021-06-10 ENCOUNTER — Telehealth (HOSPITAL_COMMUNITY): Payer: Self-pay

## 2021-06-10 NOTE — Telephone Encounter (Signed)
  No answer. Left message to return nurse call.  Marcelino Duster [facility] 06/10/2021,1434

## 2022-08-28 IMAGING — DX DG CHEST 1V
1 series · 1 of 1 positions shown · non-contrast
Comparison: Chest radiograph dated 07/14/2018.

CLINICAL DATA: Back pain.

EXAM:
CHEST  1 VIEW

[chest ap]
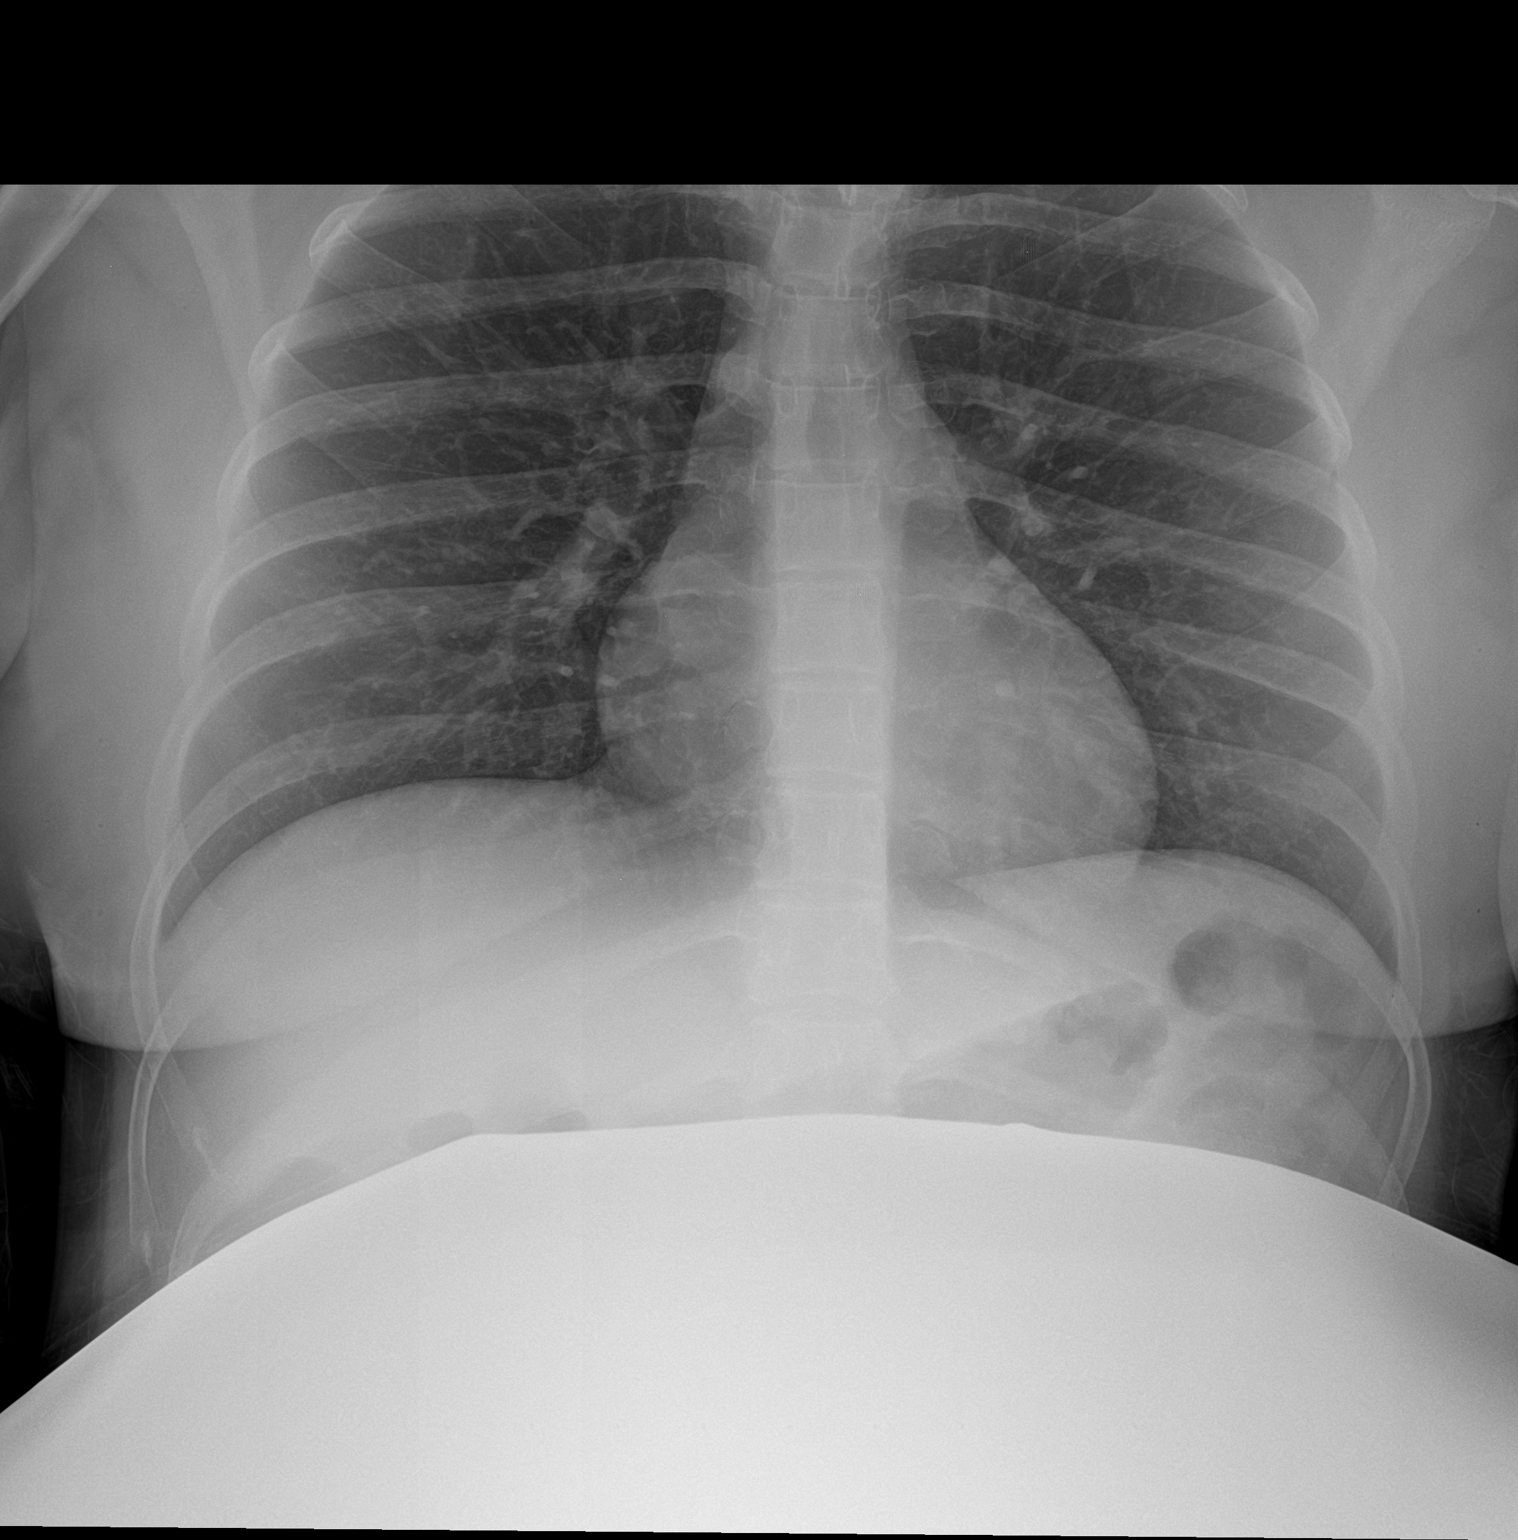

[1 of 1 positions shown; findings below may reference images not displayed]

FINDINGS: The heart size and mediastinal contours are within normal limits.
Both lungs are clear. The visualized skeletal structures are
unremarkable.
IMPRESSION: No active disease.

## 2024-08-13 ENCOUNTER — Encounter (HOSPITAL_BASED_OUTPATIENT_CLINIC_OR_DEPARTMENT_OTHER): Payer: Self-pay

## 2024-08-13 ENCOUNTER — Ambulatory Visit (HOSPITAL_BASED_OUTPATIENT_CLINIC_OR_DEPARTMENT_OTHER)
Admission: EM | Admit: 2024-08-13 | Discharge: 2024-08-13 | Disposition: A | Discharge status: Home / Self Care | Payer: Self-pay | Attending: Family Medicine | Admitting: Family Medicine

## 2024-08-13 DIAGNOSIS — R509 Fever, unspecified: Secondary | ICD-10-CM

## 2024-08-13 DIAGNOSIS — R52 Pain, unspecified: Secondary | ICD-10-CM

## 2024-08-13 DIAGNOSIS — J101 Influenza due to other identified influenza virus with other respiratory manifestations: Secondary | ICD-10-CM

## 2024-08-13 DIAGNOSIS — R051 Acute cough: Secondary | ICD-10-CM

## 2024-08-13 DIAGNOSIS — R430 Anosmia: Secondary | ICD-10-CM

## 2024-08-13 DIAGNOSIS — J029 Acute pharyngitis, unspecified: Secondary | ICD-10-CM

## 2024-08-13 LAB — POC COVID19/FLU A&B COMBO
Covid Antigen, POC: NEGATIVE
Influenza A Antigen, POC: POSITIVE — AB
Influenza B Antigen, POC: NEGATIVE

## 2024-08-13 LAB — POCT RAPID STREP A (OFFICE): Rapid Strep A Screen: NEGATIVE

## 2024-08-13 MED ORDER — PREDNISONE 20 MG PO TABS: 20.0000 mg | ORAL_TABLET | Freq: Every day | ORAL | 0 refills | Status: AC

## 2024-08-13 MED ORDER — ALBUTEROL SULFATE HFA 108 (90 BASE) MCG/ACT IN AERS: 2.0000 | INHALATION_SPRAY | RESPIRATORY_TRACT | 0 refills | Status: AC | PRN

## 2024-08-13 MED ORDER — OSELTAMIVIR PHOSPHATE 75 MG PO CAPS: 75.0000 mg | ORAL_CAPSULE | Freq: Two times a day (BID) | ORAL | 0 refills | Status: AC

## 2024-08-13 MED ORDER — PROMETHAZINE-DM 6.25-15 MG/5ML PO SYRP: 5.0000 mL | ORAL_SOLUTION | Freq: Four times a day (QID) | ORAL | 0 refills | Status: AC | PRN

## 2024-08-13 NOTE — Discharge Instructions (Addendum)
 Influenza type A with sore throat, loss of smell, body aches, fever and cough: Rapid strep is negative.  Throat culture sent.  Will adjust the plan of care, if needed once the culture results.  Negative for flu B and COVID but positive for influenza type A.    Patient has a deep congested cough but no wheezing at this time.  Prednisone 20 mg daily for 5 days.  Tamiflu 75 mg twice daily for 5 days.  Promethazine DM, 2.5-5 mL, every 6 hours if needed for cough.  Promethazine DM can be sedating so do not use and drive.  Albuterol inhaler, 2 puffs, every 4 hours if needed if you start wheezing.  Get plenty of fluids and rest.  Work excuse provided.  Follow-up if symptoms do not improve, worsen or new symptoms occur.  See below for signs and symptoms of worsening condition and reasons to go to an emergency room.  Get help right away if (regarding a Fever): You have shortness of breath or trouble breathing. You feel dizzy or you faint. You are confused and do not know the time of day, where you are, or who you are (disoriented). You have severe pain in your abdomen.  Get help right away if: (regarding the Flu): You become short of breath or have trouble breathing. Your skin or nails turn blue. You have very bad pain or stiffness in your neck. You get a sudden headache or pain in your face or ear. You vomit each time you eat or drink. These symptoms may be an emergency. Call 911 right away. Do not wait to see if the symptoms will go away. Do not drive yourself to the hospital.

## 2024-08-13 NOTE — ED Provider Notes (Signed)
 " Deborah Jarvis    CSN: [id number] Arrival date & time: 08/13/24  1035      History   Chief Complaint Chief Complaint  Patient presents with   Cough   Fatigue   Fever   Back Pain   Sore Throat   Headache    HPI Deborah Jarvis is a 24 y.o. female.   24 year old female with complaint of fever off and on since approximately 08/10/2024 in the late evening.  Her symptoms are sore throat, hoarse voice, nasal congestion, headache, body aches, cough, jaw pain on the right, lost sense of smell and some small amounts of bleeding when she blows her nose hard.  She is having some sensitivity to light with her eyes and it hurts for her to talk sometimes she is having some chest tightness with the cough.  Home COVID flu test was negative on 08/12/2024 midday.  Most recent fever was this morning and it was 100.9.   Cough Associated symptoms: fever, headaches, rhinorrhea and sore throat   Associated symptoms: no chest pain, no chills, no ear pain, no rash and no shortness of breath   Fever Associated symptoms: congestion, cough, headaches, rhinorrhea and sore throat   Associated symptoms: no chest pain, no chills, no diarrhea, no dysuria, no ear pain, no nausea, no rash and no vomiting   Back Pain Associated symptoms: fever and headaches   Associated symptoms: no abdominal pain, no chest pain and no dysuria   Sore Throat Associated symptoms include headaches. Pertinent negatives include no chest pain, no abdominal pain and no shortness of breath.  Headache Associated symptoms: back pain, congestion, cough, drainage, fatigue, fever, photophobia and sore throat   Associated symptoms: no abdominal pain, no diarrhea, no ear pain, no eye pain, no nausea, no seizures and no vomiting     Past Medical History:  Diagnosis Date   Asthma    Hypoglycemia     Patient Active Problem List   Diagnosis Date Noted   Post term pregnancy 05/28/2021   Idiopathic urticaria 04/05/2015   H/O  extrinsic asthma 04/05/2015   Migraine headache 04/05/2015   GERD (gastroesophageal reflux disease) 04/05/2015   Allergic rhinitis 04/05/2015    Past Surgical History:  Procedure Laterality Date   ADENOIDECTOMY     TONSILLECTOMY     WISDOM TOOTH EXTRACTION      OB History     Gravida  1   Para  1   Term  1   Preterm      AB      Living  1      SAB      IAB      Ectopic      Multiple  0   Live Births  1            Home Medications    Prior to Admission medications  Medication Sig Start Date End Date Taking? Authorizing Provider  albuterol (VENTOLIN HFA) 108 (90 Base) MCG/ACT inhaler Inhale 2 puffs into the lungs every 4 (four) hours as needed for wheezing or shortness of breath. 08/13/24  Yes Ival Domino, FNP  oseltamivir (TAMIFLU) 75 MG capsule Take 1 capsule (75 mg total) by mouth every 12 (twelve) hours. 08/13/24  Yes Ival Domino, FNP  predniSONE (DELTASONE) 20 MG tablet Take 1 tablet (20 mg total) by mouth daily with breakfast for 5 days. 08/13/24 08/18/24 Yes Ival Domino, FNP  promethazine-dextromethorphan (PROMETHAZINE-DM) 6.25-15 MG/5ML syrup Take 5 mLs by mouth 4 (  four) times daily as needed for cough. Do not use and drive - May make drowsy. 08/13/24  Yes Ival Domino, FNP    Family History History reviewed. No pertinent family history.  Social History Social History[1]   Allergies   Morphine and codeine   Review of Systems Review of Systems  Constitutional:  Positive for fatigue and fever. Negative for chills.  HENT:  Positive for congestion, postnasal drip, rhinorrhea, sore throat and voice change. Negative for ear pain.   Eyes:  Positive for photophobia. Negative for pain and visual disturbance.  Respiratory:  Positive for cough and chest tightness. Negative for shortness of breath.   Cardiovascular:  Negative for chest pain and palpitations.  Gastrointestinal:  Negative for abdominal pain, constipation, diarrhea, nausea and vomiting.   Genitourinary:  Negative for dysuria and hematuria.  Musculoskeletal:  Positive for back pain. Negative for arthralgias.  Skin:  Negative for color change and rash.  Neurological:  Positive for headaches. Negative for seizures and syncope.  All other systems reviewed and are negative.    Physical Exam Triage Vital Signs ED Triage Vitals  Encounter Vitals Group     BP 08/13/24 1233 96/68     Girls Systolic BP Percentile --      Girls Diastolic BP Percentile --      Boys Systolic BP Percentile --      Boys Diastolic BP Percentile --      Pulse Rate 08/13/24 1233 (!) 103     Resp 08/13/24 1233 16     Temp 08/13/24 1233 99 F (37.2 C)     Temp Source 08/13/24 1233 Oral     SpO2 08/13/24 1233 97 %     Weight --      Height --      Head Circumference --      Peak Flow --      Pain Score 08/13/24 1231 7     Pain Loc --      Pain Education --      Exclude from Growth Chart --    No data found.  Updated Vital Signs BP 96/68 (BP Location: Right Arm)   Pulse (!) 103   Temp 99 F (37.2 C) (Oral)   Resp 16   LMP 07/28/2024 (Exact Date)   SpO2 97%   Breastfeeding No   Visual Acuity Right Eye Distance:   Left Eye Distance:   Bilateral Distance:    Right Eye Near:   Left Eye Near:    Bilateral Near:     Physical Exam Vitals and nursing note reviewed.  Constitutional:      General: She is not in acute distress.    Appearance: She is well-developed. She is ill-appearing. She is not toxic-appearing or diaphoretic.  HENT:     Head: Normocephalic and atraumatic.     Jaw: Tenderness present. No trismus, swelling, pain on movement or malocclusion.     Right Ear: Hearing, tympanic membrane, ear canal and external ear normal.     Left Ear: Hearing, tympanic membrane, ear canal and external ear normal.     Nose: Mucosal edema, congestion and rhinorrhea present. Rhinorrhea is clear and bloody.     Right Sinus: No maxillary sinus tenderness or frontal sinus tenderness.     Left  Sinus: No maxillary sinus tenderness or frontal sinus tenderness.     Mouth/Throat:     Lips: Pink.     Mouth: Mucous membranes are moist. No oral lesions.  Dentition: Normal dentition.     Pharynx: Uvula midline. Posterior oropharyngeal erythema and postnasal drip (Clear and white nasal discharge seen as postnasal drip.) present. No oropharyngeal exudate.     Tonsils: No tonsillar exudate (No enlargement or exudate but some erythema).  Eyes:     General: Lids are normal.        Right eye: No foreign body, discharge or hordeolum.        Left eye: No foreign body, discharge or hordeolum.     Extraocular Movements: Extraocular movements intact.     Conjunctiva/sclera: Conjunctivae normal.     Pupils: Pupils are equal, round, and reactive to light.     Comments: Eye exam appears normal but she is having photosensitivity.  She is wearing sunglasses due to the photosensitivity.  Cardiovascular:     Rate and Rhythm: Normal rate and regular rhythm.     Heart sounds: S1 normal and S2 normal. No murmur heard. Pulmonary:     Effort: Pulmonary effort is normal. No respiratory distress.     Breath sounds: Normal breath sounds. No decreased breath sounds, wheezing, rhonchi or rales.     Comments: She has a deep wet cough but her lungs are clear throughout without wheezing nor rhonchi.  Her oxygen saturation is 97% on room air. Abdominal:     General: Bowel sounds are normal.     Palpations: Abdomen is soft.     Tenderness: There is no abdominal tenderness.  Musculoskeletal:        General: No swelling.     Cervical back: Neck supple.  Lymphadenopathy:     Head:     Right side of head: Tonsillar adenopathy present. No submental, submandibular, preauricular or posterior auricular adenopathy.     Left side of head: Tonsillar adenopathy present. No submental, submandibular, preauricular or posterior auricular adenopathy.     Cervical: Cervical adenopathy present.     Right cervical: Superficial  cervical adenopathy present.     Left cervical: Superficial cervical adenopathy present.  Skin:    General: Skin is warm and dry.     Capillary Refill: Capillary refill takes less than 2 seconds.     Findings: No rash.  Neurological:     Mental Status: She is alert and oriented to person, place, and time.  Psychiatric:        Mood and Affect: Mood normal.      UC Treatments / Results  Labs (all labs ordered are listed, but only abnormal results are displayed) Labs Reviewed  POC COVID19/FLU A&B COMBO - Abnormal; Notable for the following components:      Result Value   Influenza A Antigen, POC Positive (*)    All other components within normal limits  POCT RAPID STREP A (OFFICE) - Normal  CULTURE, GROUP A STREP [facility])    EKG   Radiology No results found.  Procedures Procedures (including critical Jarvis time)  Medications Ordered in UC Medications - No data to display  Initial Impression / Assessment and Plan / UC Course  I have reviewed the triage vital signs and the nursing notes.  Pertinent labs & imaging results that were available during my Jarvis of the patient were reviewed by me and considered in my medical decision making (see chart for details).  Plan of Jarvis (see discharge instructions for additional patient precautions and education): Influenza type A with sore throat, loss of smell, body aches, fever and cough: Rapid strep is negative.  Throat culture sent.  Will  adjust the plan of Jarvis, if needed once the culture results.  Negative for flu B and COVID but positive for influenza type A.    Patient has a deep congested cough but no wheezing at this time.  Prednisone 20 mg daily for 5 days.  Tamiflu 75 mg twice daily for 5 days.  Promethazine DM, 2.5-5 mL, every 6 hours if needed for cough.  Promethazine DM can be sedating so do not use and drive.  Albuterol inhaler, 2 puffs, every 4 hours if needed if you start wheezing.  Get plenty of fluids and rest.  Work  excuse provided.  Follow-up if symptoms do not improve, worsen or new symptoms occur.  I reviewed the plan of Jarvis with the patient and/or the patient's guardian.  The patient and/or guardian had time to ask questions and acknowledged that the questions were answered.  Final Clinical Impressions(s) / UC Diagnoses   Final diagnoses:  Acute cough  Fever, unspecified  Generalized body aches  Loss of smell  Sore throat  Type A influenza     Discharge Instructions      Influenza type A with sore throat, loss of smell, body aches, fever and cough: Rapid strep is negative.  Throat culture sent.  Will adjust the plan of Jarvis, if needed once the culture results.  Negative for flu B and COVID but positive for influenza type A.    Patient has a deep congested cough but no wheezing at this time.  Prednisone 20 mg daily for 5 days.  Tamiflu 75 mg twice daily for 5 days.  Promethazine DM, 2.5-5 mL, every 6 hours if needed for cough.  Promethazine DM can be sedating so do not use and drive.  Albuterol inhaler, 2 puffs, every 4 hours if needed if you start wheezing.  Get plenty of fluids and rest.  Work excuse provided.  Follow-up if symptoms do not improve, worsen or new symptoms occur.     ED Prescriptions     Medication Sig Dispense Auth. Provider   oseltamivir (TAMIFLU) 75 MG capsule Take 1 capsule (75 mg total) by mouth every 12 (twelve) hours. 10 capsule Ival Domino, FNP   promethazine-dextromethorphan (PROMETHAZINE-DM) 6.25-15 MG/5ML syrup Take 5 mLs by mouth 4 (four) times daily as needed for cough. Do not use and drive - May make drowsy. 118 mL Ival Domino, FNP   predniSONE (DELTASONE) 20 MG tablet Take 1 tablet (20 mg total) by mouth daily with breakfast for 5 days. 5 tablet Suhaila Troiano, FNP   albuterol (VENTOLIN HFA) 108 (90 Base) MCG/ACT inhaler Inhale 2 puffs into the lungs every 4 (four) hours as needed for wheezing or shortness of breath. 1 each Ival Domino, FNP      PDMP  not reviewed this encounter.    [1]  Social History Tobacco Use   Smoking status: Never   Smokeless tobacco: Never  Vaping Use   Vaping status: Never Used  Substance Use Topics   Alcohol use: Never   Drug use: Never     Ival Domino, FNP 08/13/24 1317  "

## 2024-08-13 NOTE — ED Triage Notes (Signed)
 Started a few days ago Sore throat Hoarse voice Fever off and on 100 F, this morning fever of 100.9 F Has been taking OTC medications Yesterday, headache, body aches, phlegm, cough Pain in neck, back Unable to look at the light Unable to speak, throat feels like sand paper Chest pain  Took at home COVID/Flu test yesterday and it was negative
